# Patient Record
Sex: Male | Born: 1955 | Race: Black or African American | Hispanic: No | Marital: Married | Smoking: Former smoker
Health system: Southern US, Community
[De-identification: ages and names within clinical notes are randomized; demographics above are authoritative.]

## PROBLEM LIST (undated history)

## (undated) DIAGNOSIS — E7800 Pure hypercholesterolemia, unspecified: Secondary | ICD-10-CM

## (undated) DIAGNOSIS — K219 Gastro-esophageal reflux disease without esophagitis: Secondary | ICD-10-CM

## (undated) HISTORY — DX: Gastro-esophageal reflux disease without esophagitis: K21.9

## (undated) HISTORY — DX: Pure hypercholesterolemia, unspecified: E78.00

---

## 2001-04-08 ENCOUNTER — Ambulatory Visit (HOSPITAL_COMMUNITY): Admission: RE | Admit: 2001-04-08 | Discharge: 2001-04-08 | Payer: Self-pay | Admitting: Cardiology

## 2001-04-08 ENCOUNTER — Encounter: Payer: Self-pay | Admitting: Cardiology

## 2008-03-23 ENCOUNTER — Encounter: Admission: RE | Admit: 2008-03-23 | Discharge: 2008-03-23 | Payer: Self-pay | Admitting: Occupational Medicine

## 2009-01-10 HISTORY — PX: COLONOSCOPY: SHX174

## 2009-08-30 ENCOUNTER — Encounter: Admission: RE | Admit: 2009-08-30 | Discharge: 2009-08-30 | Payer: Self-pay | Admitting: Neurosurgery

## 2010-08-21 IMAGING — CT CT ANGIO PELVIS
2 of 5 series · 14 of 46 positions shown, 19 images · IV contrast ([ID] [id number])
Comparison: None.

CLINICAL DATA: Left leg numbness, pain, evaluate for left
iliofemoral vascular disease

CT ANGIOGRAPHY PELVIS
TECHNIQUE: Multidetector CT imaging of the pelvis was performed
using the standard protocol during bolus administration of
intravenous contrast.  Multiplanar CT image reconstructions
including MIPs were obtained to evaluate the vascular anatomy.
Contrast:  100 ml 0mnipaque-QAA

[Series 5: angio · axial · 0.70mm/px · z∈[-248,-25]mm · 11 of 107 slices shown, 16 images]
[im 9/107  soft-tissue]
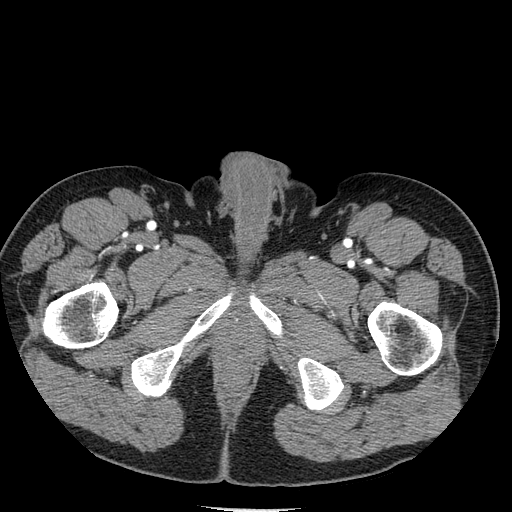
[im 9/107  bone]
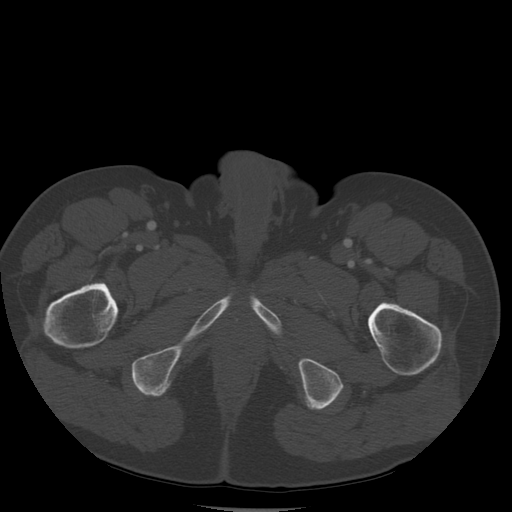
[im 17/107  soft-tissue]
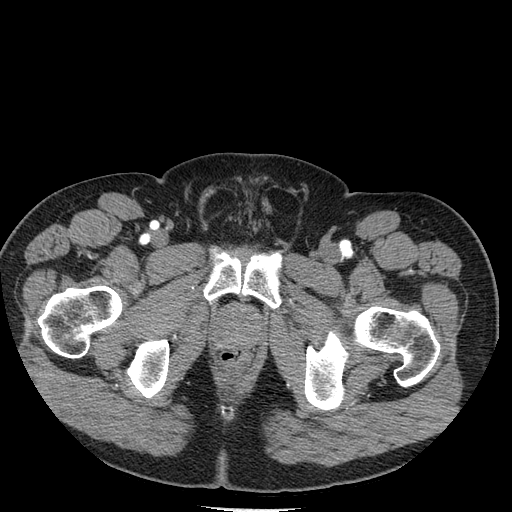
[im 33/107  soft-tissue]
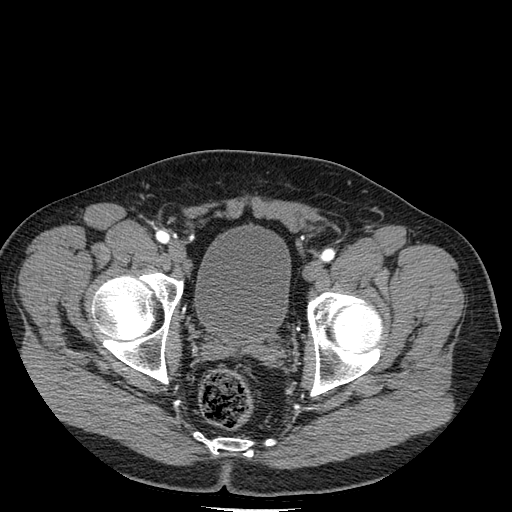
[im 41/107  soft-tissue]
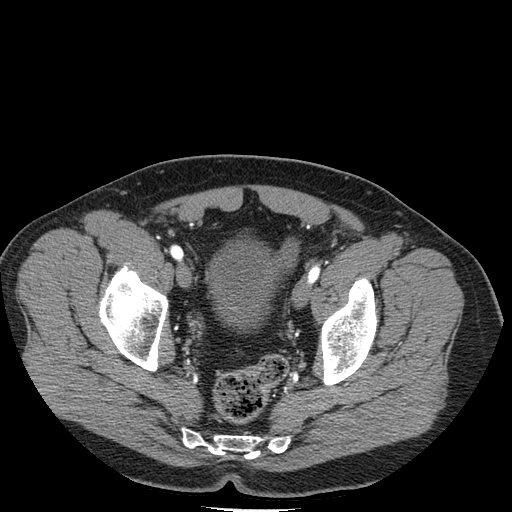
[im 49/107  soft-tissue]
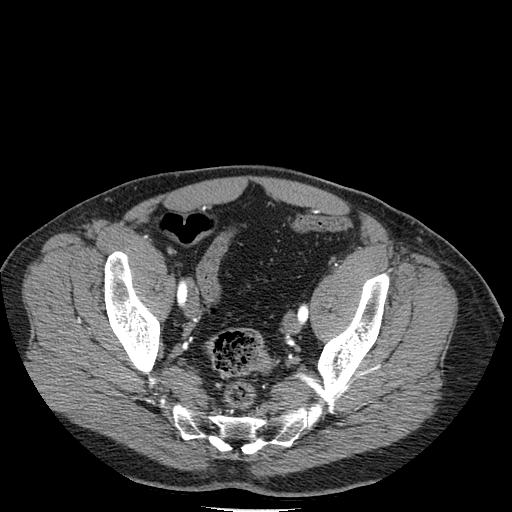
[im 58/107  soft-tissue]
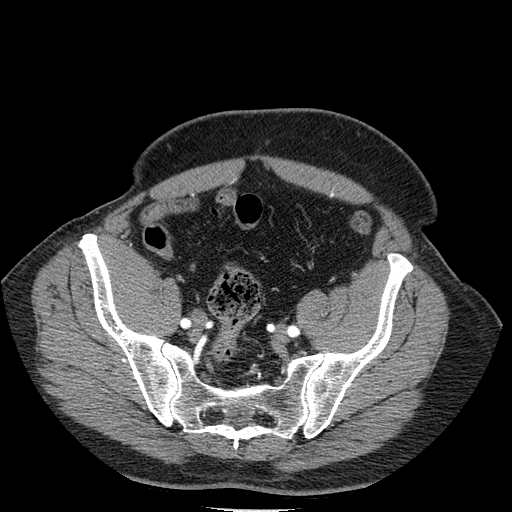
[im 66/107  soft-tissue]
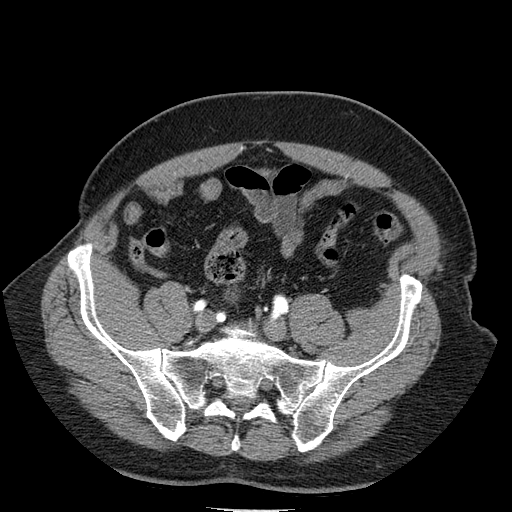
[im 74/107  lung]
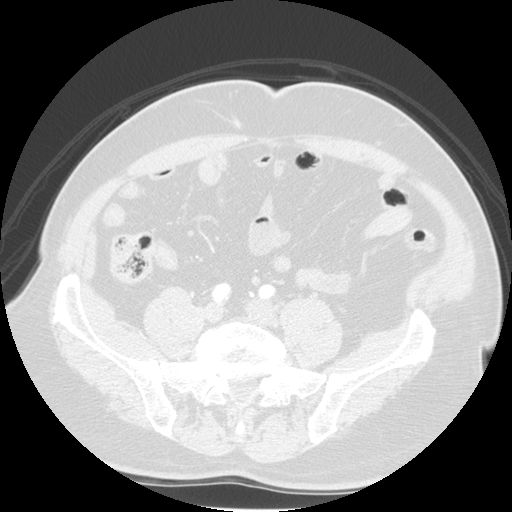
[im 82/107  soft-tissue]
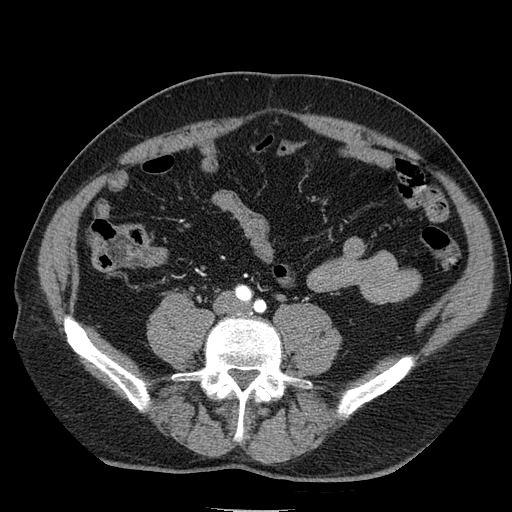
[im 82/107  lung]
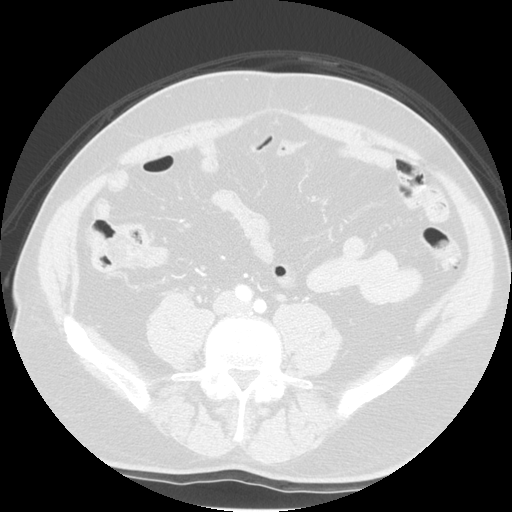
[im 90/107  soft-tissue]
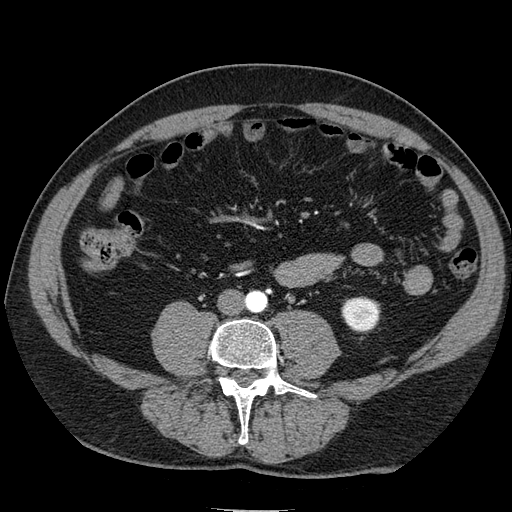
[im 90/107  lung]
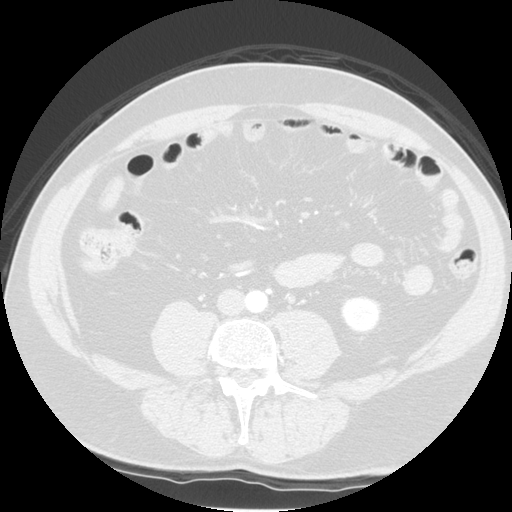
[im 90/107  bone]
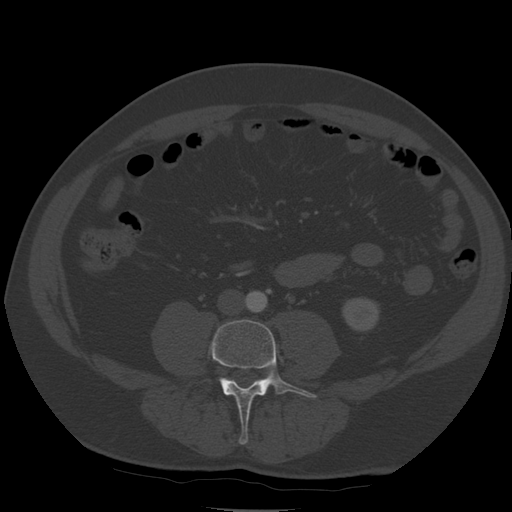
[im 98/107  soft-tissue]
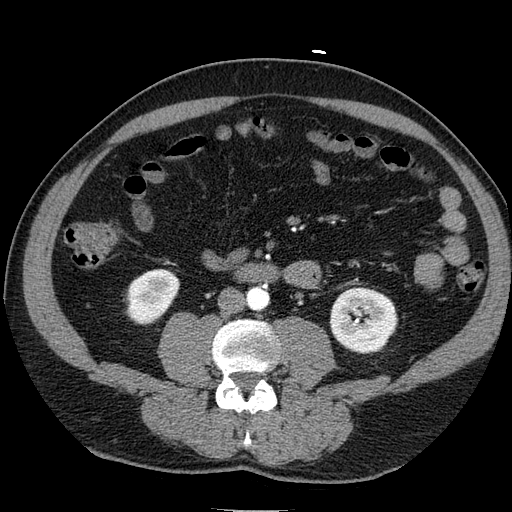
[im 98/107  lung]
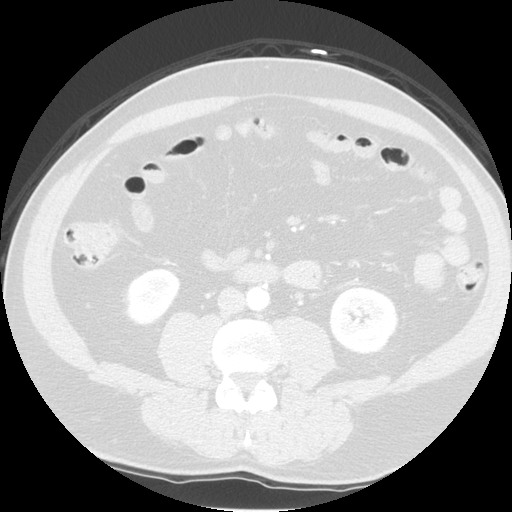

[Series 400: cor · coronal · 0.70mm/px · 3 of 153 slices shown]
[im 51/153  soft-tissue]
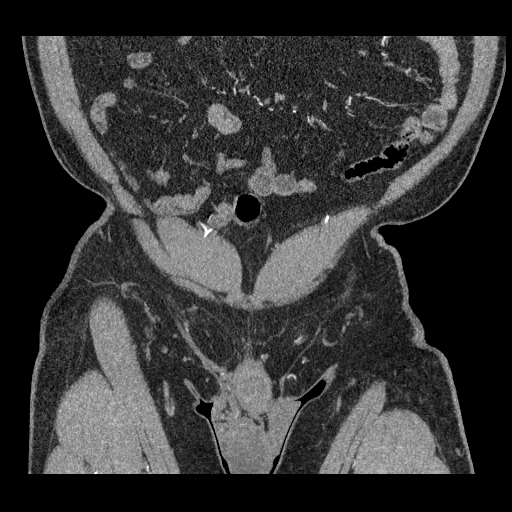
[im 68/153  soft-tissue]
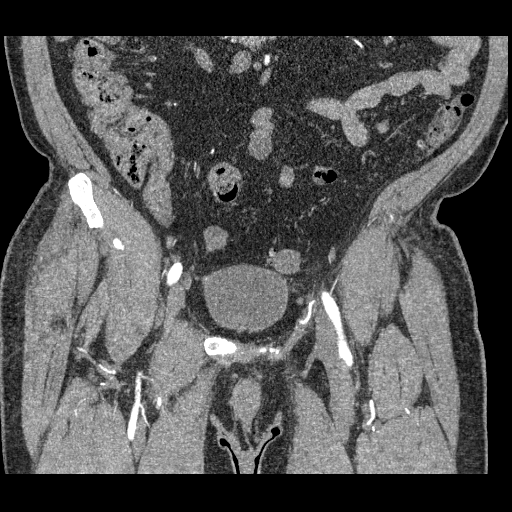
[im 85/153  soft-tissue]
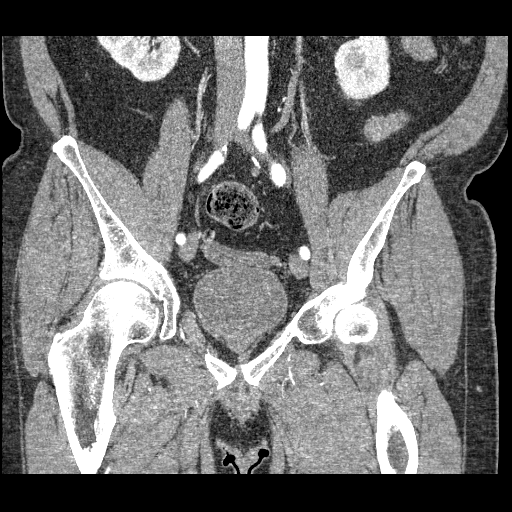

[14 of 46 positions shown; findings below may reference images not displayed]

FINDINGS: The lower portion of the infrarenal aorta is normal in
caliber.  No visualized lower infrarenal aortic aneurysm,
dissection or occlusive disease.  IMA is patent off of the anterior
aorta.  Mild tortuosity of the iliac vessels.  The common, internal
and external iliac arteries are patent.  Negative for iliac
significant stenosis, occlusion, dissection or aneurysm on either
side.  The common femoral, proximal SFA and profunda femoral
arteries are also visualized and patent.

Additional axial imaging:  There is diffuse diverticulosis of the
sigmoid.  No free fluid, fluid collection, hemorrhage or abscess.
Normal urinary bladder.  No bowel obstruction appreciate.  No
adenopathy.  Degenerative changes at L5-S1.  Small fat containing
inguinal hernias are noted bilaterally.

 Review of the MIP images confirms the above findings.
IMPRESSION: Patent pelvic iliac vessels.  Negative for significant iliac
stenosis, occlusion or dissection.
Negative for aneurysm

Sigmoid diverticulosis

Degenerative changes of L5 S1

Small fat containing inguinal hernias.

## 2011-01-12 NOTE — Cardiovascular Report (Signed)
[place]  Patient:    Juan Ruiz, Juan Ruiz                      MRN: [id number] Proc. Date: 04/08/01 Adm. Date:  [id number] Attending:  Lenoria Farrier CC:         Feliciana Rossetti, M.D., Jani Files C. Wall, M.D. [facility]  Cardiopulmonary Laboratory   Cardiac Catheterization  PROCEDURES PERFORMED:  Cardiac catheterization.  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  He had developed chest pain and was evaluated with a Cardiolite scan in [place], which suggested inferior ischemia.  Subsequently, he was referred by Dr. Shary Decamp to Dr. Daleen Squibb, who evaluated him there in our [place] office. Dr. Daleen Squibb thought his symptoms were somewhat atypical for ischemia, but felt that since he was a truck driver and had positive risk factors including a history of tobacco use, that he should be evaluated with angiography.  DESCRIPTION OF PROCEDURE:  The procedure was performed via the right femoral artery using an arterial sheath and 6 French preformed coronary catheters.  A front wall arterial puncture was performed and Omnipaque contrast was used. The right femoral artery was closed with Perclose.  The patient tolerated the procedure well and left the laboratory in satisfactory condition.  RESULTS:  The aortic pressure was 100/58 with a mean of 74.  Left ventricular pressure was 100/13.  The left main coronary artery:  The left main coronary artery was free of significant disease.  Left anterior descending:  The left anterior descending artery gave rise to two diagonal branches and three septal perforators.  These and the LAD proper were free of significant disease.  Circumflex artery:  The circumflex artery gave rise to a small marginal branch, an atrial branch, and two posterolateral branches.  These vessels were free of significant disease.  Right coronary artery:  The right coronary is a dominant vessel that  gave rise to a conus branch, right ventricular branch, a posterior descending branch and a posterolateral branch.  These vessels were free of significant disease.  LEFT VENTRICULOGRAPHY:  The left ventriculogram performed in the RAO projection shows good wall motion with no areas of hypokinesis.  The estimated ejection fraction was 60%.  CONCLUSIONS:  Normal coronary angiography and normal left ventricular wall motion.  RECOMMENDATIONS:  Reassurance.  In view of these findings, I think the recent stress test was probably a false-positive test.  I do not think his symptoms are related to ischemia.  We will plan to reassure him and let him go back to work in a few days and he will follow up with Dr. Shary Decamp.  I spoke with Dr. Shary Decamp regarding the findings today. DD:  04/08/01 TD:  04/08/01 Job: [id number]

## 2011-08-28 HISTORY — PX: INGUINAL HERNIA REPAIR: SUR1180

## 2011-08-28 HISTORY — PX: CHOLECYSTECTOMY: SHX55

## 2016-01-18 DIAGNOSIS — K219 Gastro-esophageal reflux disease without esophagitis: Secondary | ICD-10-CM | POA: Insufficient documentation

## 2016-01-18 DIAGNOSIS — E291 Testicular hypofunction: Secondary | ICD-10-CM | POA: Insufficient documentation

## 2016-01-18 DIAGNOSIS — I10 Essential (primary) hypertension: Secondary | ICD-10-CM | POA: Insufficient documentation

## 2016-01-19 DIAGNOSIS — Z720 Tobacco use: Secondary | ICD-10-CM | POA: Insufficient documentation

## 2016-01-19 DIAGNOSIS — R079 Chest pain, unspecified: Secondary | ICD-10-CM | POA: Insufficient documentation

## 2016-01-19 DIAGNOSIS — R9431 Abnormal electrocardiogram [ECG] [EKG]: Secondary | ICD-10-CM | POA: Insufficient documentation

## 2016-01-19 DIAGNOSIS — E7800 Pure hypercholesterolemia, unspecified: Secondary | ICD-10-CM | POA: Insufficient documentation

## 2017-01-18 DIAGNOSIS — E119 Type 2 diabetes mellitus without complications: Secondary | ICD-10-CM | POA: Insufficient documentation

## 2017-01-18 DIAGNOSIS — Z79899 Other long term (current) drug therapy: Secondary | ICD-10-CM | POA: Insufficient documentation

## 2017-04-26 DIAGNOSIS — N1831 Chronic kidney disease, stage 3a: Secondary | ICD-10-CM | POA: Insufficient documentation

## 2017-07-17 DIAGNOSIS — R972 Elevated prostate specific antigen [PSA]: Secondary | ICD-10-CM | POA: Insufficient documentation

## 2017-08-09 DIAGNOSIS — G4733 Obstructive sleep apnea (adult) (pediatric): Secondary | ICD-10-CM | POA: Insufficient documentation

## 2017-08-09 DIAGNOSIS — R5382 Chronic fatigue, unspecified: Secondary | ICD-10-CM | POA: Insufficient documentation

## 2018-10-15 ENCOUNTER — Encounter: Payer: Self-pay | Admitting: Gastroenterology

## 2018-10-24 ENCOUNTER — Encounter: Payer: Self-pay | Admitting: Gastroenterology

## 2018-10-31 ENCOUNTER — Ambulatory Visit: Payer: BLUE CROSS/BLUE SHIELD | Admitting: Gastroenterology

## 2018-10-31 ENCOUNTER — Encounter (INDEPENDENT_AMBULATORY_CARE_PROVIDER_SITE_OTHER): Payer: Self-pay

## 2018-10-31 ENCOUNTER — Encounter: Payer: Self-pay | Admitting: Gastroenterology

## 2018-10-31 VITALS — BP 138/90 | HR 70 | Ht 67.0 in | Wt 193.0 lb

## 2018-10-31 DIAGNOSIS — Z1211 Encounter for screening for malignant neoplasm of colon: Secondary | ICD-10-CM

## 2018-10-31 MED ORDER — SUPREP BOWEL PREP KIT 17.5-3.13-1.6 GM/177ML PO SOLN: 1.0000 | ORAL | 0 refills | Status: AC

## 2018-10-31 NOTE — Progress Notes (Signed)
Chief Complaint:   Referring Provider:  No ref. provider found      ASSESSMENT AND PLAN;   #1. Colorectal cancer screening. #2. FH colonic polyps (dad > age 65).  Plan:  - Proceed with colonoscopy. Discussed risks & benefits. (Risks including rare perforation req laparotomy, bleeding after biopsies/polypectomy req blood transfusion, rare chance of missing neoplasms, risks of anesthesia/sedation). Benefits outweigh the risks. Patient agrees to proceed. All the questions were answered. Consent forms given for review.    HPI:    Juan Ruiz is a 63 y.o. male  No nausea, vomiting, heartburn, regurgitation, odynophagia or dysphagia.  No significant diarrhea or constipation.  There is no melena or hematochezia. No unintentional weight loss. Dad had polyps at age 27. No abdo pain.  SH: Accompanied by his wife-Gracie Ludeke (patient of ours) Past GI procedures: -Colonoscopy 12/2008 ([facility])-predominantly left colonic diverticulosis, small internal hemorrhoids. Past Medical History:  Diagnosis Date  . GERD (gastroesophageal reflux disease)   . Hypercholesteremia     Past Surgical History:  Procedure Laterality Date  . CHOLECYSTECTOMY  2013  . COLONOSCOPY  01/10/2009   Predominantly left colonic diverticulosis. Small internal hemorrhoids. Otherwise normal colonoscopy.   . INGUINAL HERNIA REPAIR  2013   Dr. Logan Bores    Family History  Problem Relation Age of Onset  . Diabetes Father   . Colon cancer Neg Hx     Social History   Tobacco Use  . Smoking status: Former Games developer  . Smokeless tobacco: Former Engineer, water Use Topics  . Alcohol use: Not Currently  . Drug use: Never    Current Outpatient Medications  Medication Sig Dispense Refill  . esomeprazole (NEXIUM) 20 MG capsule Take 20 mg by mouth 2 (two) times daily.    . fluticasone (FLONASE) 50 MCG/ACT nasal spray Place 1-2 sprays into both nostrils daily.    Marland Kitchen gentamicin (GARAMYCIN) 0.3 % ophthalmic solution  Place 1 drop into both eyes as needed.    Marland Kitchen losartan (COZAAR) 100 MG tablet Take 100 mg by mouth daily.    . pravastatin (PRAVACHOL) 80 MG tablet Take 80 mg by mouth at bedtime.    . sildenafil (REVATIO) 20 MG tablet Take 20 mg by mouth as needed. Take up to 4 tablets by mouth 1 hour prior to sex as needed.    Marland Kitchen spironolactone (ALDACTONE) 25 MG tablet Take 25 mg by mouth daily.    . traMADol (ULTRAM) 50 MG tablet Take 50 mg by mouth every 8 (eight) hours as needed.     No current facility-administered medications for this visit.     Not on File  Review of Systems:  Constitutional: Denies fever, chills, diaphoresis, appetite change and fatigue.  HEENT: Denies photophobia, eye pain, redness, hearing loss, ear pain, congestion, sore throat, rhinorrhea, sneezing, mouth sores, neck pain, neck stiffness and tinnitus.   Respiratory: Denies SOB, DOE, cough, chest tightness,  and wheezing.   Cardiovascular: Denies chest pain, palpitations and leg swelling.  Genitourinary: Denies dysuria, urgency, frequency, hematuria, flank pain and difficulty urinating.  Musculoskeletal: Denies myalgias, back pain, joint swelling, arthralgias and gait problem.  Skin: No rash.  Neurological: Denies dizziness, seizures, syncope, weakness, light-headedness, numbness and headaches.  Hematological: Denies adenopathy. Easy bruising, personal or family bleeding history  Psychiatric/Behavioral: No anxiety or depression     Physical Exam:    BP 138/90   Pulse 70   Ht 5\' 7"  (1.702 m)   Wt 193 lb (87.5 kg)  SpO2 96%   BMI 30.23 kg/m  Filed Weights   10/31/18 1344  Weight: 193 lb (87.5 kg)   Constitutional:  Well-developed, in no acute distress. Psychiatric: Normal mood and affect. Behavior is normal. HEENT: Pupils normal.  Conjunctivae are normal. No scleral icterus. Cardiovascular: Normal rate, regular rhythm. No edema Pulmonary/chest: Effort normal and breath sounds normal. No wheezing, rales or  rhonchi. Abdominal: Soft, nondistended. Nontender. Bowel sounds active throughout. There are no masses palpable. No hepatomegaly. Rectal:  defered Neurological: Alert and oriented to person place and time. Skin: Skin is warm and dry. No rashes noted.     Edman Circle, MD 10/31/2018, 1:59 PM  Cc: No ref. provider found

## 2018-10-31 NOTE — Patient Instructions (Signed)
If you are age 65 or older, your body mass index should be between 23-30. Your Body mass index is 30.23 kg/m. If this is out of the aforementioned range listed, please consider follow up with your Primary Care Provider.  If you are age 71 or younger, your body mass index should be between 19-25. Your Body mass index is 30.23 kg/m. If this is out of the aformentioned range listed, please consider follow up with your Primary Care Provider.    You have been scheduled for a colonoscopy. Please follow written instructions given to you at your visit today.  Please pick up your prep supplies at the pharmacy within the next 1-3 days. If you use inhalers (even only as needed), please bring them with you on the day of your procedure. Your physician has requested that you go to www.startemmi.com and enter the access code given to you at your visit today. This web site gives a general overview about your procedure. However, you should still follow specific instructions given to you by our office regarding your preparation for the procedure.   We have sent the following medications to your pharmacy for you to pick up at your convenience:  Suprep  Thank you,  Dr. Lynann Bologna

## 2018-11-24 ENCOUNTER — Encounter: Payer: Self-pay | Admitting: Gastroenterology

## 2018-12-08 ENCOUNTER — Encounter: Payer: BLUE CROSS/BLUE SHIELD | Admitting: Gastroenterology

## 2018-12-29 ENCOUNTER — Telehealth: Payer: Self-pay | Admitting: *Deleted

## 2018-12-29 NOTE — Telephone Encounter (Signed)
Spoke with patient to reschedule the screening colonoscopy. Pt request to cancelled the colonoscopy until "all this virus is over". Pt denies any GI concerns/symptoms. Patient needs a Monday so I did offer patient to have Monday 5/11 but he still declined and states he will call us back to reschedule this colonoscopy when he feels comfortable. Colonoscopy cancelled at this time pt is aware.

## 2019-01-12 ENCOUNTER — Encounter: Payer: BLUE CROSS/BLUE SHIELD | Admitting: Gastroenterology

## 2019-04-24 DIAGNOSIS — M2550 Pain in unspecified joint: Secondary | ICD-10-CM | POA: Insufficient documentation

## 2020-05-13 DIAGNOSIS — M65331 Trigger finger, right middle finger: Secondary | ICD-10-CM | POA: Insufficient documentation

## 2021-04-21 ENCOUNTER — Ambulatory Visit: Payer: Medicare HMO | Admitting: Sports Medicine

## 2021-04-25 ENCOUNTER — Telehealth: Payer: Self-pay

## 2021-04-25 NOTE — Telephone Encounter (Signed)
Patient called to advise that she has tested positive for Covid today.  I advised per Dr. Melvyn Neth to call back in 5-7 days and we will go from there.  Dorothy Spark aware as well.

## 2021-05-12 ENCOUNTER — Ambulatory Visit: Payer: Medicare HMO | Admitting: Sports Medicine

## 2021-05-12 ENCOUNTER — Other Ambulatory Visit: Payer: Self-pay

## 2021-05-12 ENCOUNTER — Encounter: Payer: Self-pay | Admitting: Sports Medicine

## 2021-05-12 DIAGNOSIS — M79674 Pain in right toe(s): Secondary | ICD-10-CM

## 2021-05-12 DIAGNOSIS — M79675 Pain in left toe(s): Secondary | ICD-10-CM | POA: Diagnosis not present

## 2021-05-12 DIAGNOSIS — L600 Ingrowing nail: Secondary | ICD-10-CM

## 2021-05-12 NOTE — Patient Instructions (Signed)

## 2021-05-12 NOTE — Progress Notes (Signed)
Subjective: Juan Ruiz is a 65 y.o. male patient presents to office today complaining of issues with ingrown toenails.  Patient reports that the left first toe medial corner and the right first toe lateral corner there is constant pain where his daughter has to help to get his ingrown right now they are doing okay but wants to talk about treatment. Patient denies any current infection to the affected areas/fever/chills/nausea/vomitting/any other related constitutional symptoms at this time.  There are no problems to display for this patient.   Current Outpatient Medications on File Prior to Visit  Medication Sig Dispense Refill   esomeprazole (NEXIUM) 20 MG capsule Take 20 mg by mouth 2 (two) times daily.     fluticasone (FLONASE) 50 MCG/ACT nasal spray Place 1-2 sprays into both nostrils daily.     gentamicin (GARAMYCIN) 0.3 % ophthalmic solution Place 1 drop into both eyes as needed.     losartan (COZAAR) 100 MG tablet Take 100 mg by mouth daily.     pravastatin (PRAVACHOL) 80 MG tablet Take 80 mg by mouth at bedtime.     sildenafil (REVATIO) 20 MG tablet Take 20 mg by mouth as needed. Take up to 4 tablets by mouth 1 hour prior to sex as needed.     spironolactone (ALDACTONE) 25 MG tablet Take 25 mg by mouth daily.     SUPREP BOWEL PREP KIT 17.5-3.13-1.6 GM/177ML SOLN Take 1 kit by mouth as directed. 354 mL 0   traMADol (ULTRAM) 50 MG tablet Take 50 mg by mouth every 8 (eight) hours as needed.     No current facility-administered medications on file prior to visit.    Not on File  Objective:  There were no vitals filed for this visit.  General: Well developed, nourished, in no acute distress, alert and oriented x3   Dermatology: Skin is warm, dry and supple bilateral.  Right hallux nail appears to be moderately incurvated with hyperkeratosis formation at the distal aspects of the lateral nail border. (-) Erythema. (+) Edema. (-) serosanguous drainage present.  There is also  incurvation noted to the left hallux nail where the medial margin appears to be moderately incurvated with hyperkeratosis at the nail border.  Mild localized edema.  No erythema no serosanguineous drainage.  The remaining nails appear unremarkable at this time. There are no open sores, lesions or other signs of infection  present.  Vascular: Dorsalis Pedis artery and Posterior Tibial artery pedal pulses are 2/4 bilateral with immedate capillary fill time. Pedal hair growth present. No lower extremity edema.   Neruologic: Grossly intact via light touch bilateral.  Musculoskeletal: Tenderness to palpation of the left hallux medial and right hallux lateral left hallux medial and right hallux lateral nail folds.  Muscular strength within normal limits in all groups bilateral.   Assesement and Plan: Problem List Items Addressed This Visit   None Visit Diagnoses     Ingrown nail    -  Primary   Toe pain, bilateral           -Discussed treatment alternatives and plan of care; Explained permanent/temporary nail avulsion and post procedure course to patient. Patient elects for PNA left hallux medial and right hallux lateral nail borders with phenol - After a verbal and written consent, injected 3 ml of a 50:50 mixture of 2% plain  lidocaine and 0.5% plain marcaine in a normal hallux block fashion. Next, a  betadine prep was performed. Anesthesia was tested and found to be appropriate.  The offending left hallux medial and right hallux lateral nail borders were then incised from the hyponychium to the epinychium. The offending nail border was removed and cleared from the field. The area was curretted for any remaining nail or spicules. Phenol application performed and the area was then flushed with alcohol and dressed with antibiotic cream and a dry sterile dressing. -Patient was instructed to leave the dressing intact for today and begin soaking  in a weak solution of betadine or Epsom salt and  water tomorrow. Patient was instructed to soak for 15-20 minutes each day and apply neosporin/corticosporin and a gauze or bandaid dressing each day. -Patient was instructed to monitor the toe for signs of infection and return to office if toe becomes red, hot or swollen. -Advised ice, elevation, and tylenol or motrin if needed for pain.  -Patient is to return in 2 weeks for follow up care/nail check or sooner if problems arise.  Landis Martins, DPM

## 2021-06-02 ENCOUNTER — Other Ambulatory Visit: Payer: Self-pay

## 2021-06-02 ENCOUNTER — Ambulatory Visit: Payer: Medicare HMO | Admitting: Sports Medicine

## 2021-06-02 ENCOUNTER — Encounter: Payer: Self-pay | Admitting: Sports Medicine

## 2021-06-02 DIAGNOSIS — Z9889 Other specified postprocedural states: Secondary | ICD-10-CM

## 2021-06-02 DIAGNOSIS — M79675 Pain in left toe(s): Secondary | ICD-10-CM

## 2021-06-02 DIAGNOSIS — L600 Ingrowing nail: Secondary | ICD-10-CM

## 2021-06-02 DIAGNOSIS — M79674 Pain in right toe(s): Secondary | ICD-10-CM

## 2021-06-02 NOTE — Progress Notes (Signed)
Subjective: Juan Ruiz is a 65 y.o. male patient returns to office today for follow up evaluation after having PNA left hallux medial and right hallux lateral nail borders with phenol performed on 05-12-21.  Patient has been soaking using epsom salt and applying topical antibiotic covered with bandaid daily. Patient deniesfever/chills/nausea/vomitting/any other related constitutional symptoms at this time.  There are no problems to display for this patient.   Current Outpatient Medications on File Prior to Visit  Medication Sig Dispense Refill   esomeprazole (NEXIUM) 20 MG capsule Take 20 mg by mouth 2 (two) times daily.     fluticasone (FLONASE) 50 MCG/ACT nasal spray Place 1-2 sprays into both nostrils daily.     gentamicin (GARAMYCIN) 0.3 % ophthalmic solution Place 1 drop into both eyes as needed.     losartan (COZAAR) 100 MG tablet Take 100 mg by mouth daily.     pravastatin (PRAVACHOL) 80 MG tablet Take 80 mg by mouth at bedtime.     sildenafil (REVATIO) 20 MG tablet Take 20 mg by mouth as needed. Take up to 4 tablets by mouth 1 hour prior to sex as needed.     spironolactone (ALDACTONE) 25 MG tablet Take 25 mg by mouth daily.     SUPREP BOWEL PREP KIT 17.5-3.13-1.6 GM/177ML SOLN Take 1 kit by mouth as directed. 354 mL 0   traMADol (ULTRAM) 50 MG tablet Take 50 mg by mouth every 8 (eight) hours as needed.     No current facility-administered medications on file prior to visit.    Not on File  Objective:  General: Well developed, nourished, in no acute distress, alert and oriented x3   Dermatology: Skin is warm, dry and supple bilateral. Left hallux medial and right hallux lateral nail bed appears to be clean, dry, with mild granular tissue and surrounding eschar/scab. (-) Erythema. (-) Edema. (-) serosanguous drainage present. The remaining nails appear unremarkable at this time. There are no other lesions or other signs of infection present.  Neurovascular status: Intact. No  lower extremity swelling; No pain with calf compression bilateral.  Musculoskeletal: Decreased tenderness to palpation of the right and left hallux nail fold(s). Muscular strength within normal limits bilateral.   Assesement and Plan: Problem List Items Addressed This Visit   None Visit Diagnoses     S/P nail surgery    -  Primary   Ingrown nail       Toe pain, bilateral           -Examined patient  -Cleansed left hallux medial and right hallux lateral nail borders and gently scrubbed with peroxide and using a tissue nipper the left hallux medial border was gently debrided to patient's tolerance to healthy bleeding applied antibiotic cream covered with bandaid.  -Discussed plan of care with patient. -Patient d/c soaking, d apply neosporin and a gauze or bandaid dressing each day as needed. May leave open to air at night. -Educated patient on long term care after nail surgery. -Patient was instructed to monitor the toe for reoccurrence and signs of infection; Patient advised to return to office or go to ER if toe becomes red, hot or swollen. -Patient is to return as needed or sooner if problems arise.  Landis Martins, DPM

## 2021-07-24 ENCOUNTER — Telehealth: Payer: Self-pay | Admitting: Urology

## 2021-07-24 ENCOUNTER — Other Ambulatory Visit: Payer: Self-pay | Admitting: Sports Medicine

## 2021-07-24 MED ORDER — AMOXICILLIN-POT CLAVULANATE 875-125 MG PO TABS: 1.0000 | ORAL_TABLET | Freq: Two times a day (BID) | ORAL | 0 refills | Status: DC

## 2021-07-24 NOTE — Telephone Encounter (Signed)
Pt stated that the toe nail that you cut on that he had the ingrown on when he saw you back on 06/02/21 pt stats that it is still sore/red and wants to know what he needs to do? Please Advise.

## 2021-07-24 NOTE — Telephone Encounter (Signed)
Pt was advised of Dr. Wynema Birch recommendations and Rx to pick up. Pt stated understanding.

## 2021-07-24 NOTE — Progress Notes (Signed)
Rx sent to [place] and advised patient to soak for pain at toenail -Dr. Marylene Land

## 2021-08-18 ENCOUNTER — Ambulatory Visit: Payer: Medicare HMO | Admitting: Sports Medicine

## 2021-08-18 ENCOUNTER — Encounter: Payer: Self-pay | Admitting: Sports Medicine

## 2021-08-18 DIAGNOSIS — Z9889 Other specified postprocedural states: Secondary | ICD-10-CM

## 2021-08-18 DIAGNOSIS — M79675 Pain in left toe(s): Secondary | ICD-10-CM

## 2021-08-18 DIAGNOSIS — L600 Ingrowing nail: Secondary | ICD-10-CM

## 2021-08-18 DIAGNOSIS — G8929 Other chronic pain: Secondary | ICD-10-CM

## 2021-08-18 NOTE — Progress Notes (Signed)
Subjective: Juan Ruiz is a 64 y.o. male patient presents to office today complaining of pain at the left hallux medial border states that the redness is gone down since he has been on the Augmentin not as sore as it was did soak it does not see anything draining but still a little sore with pressure.  This procedure was previously done on patient back in September and still has some issues with it states that the right 1 has healed up really well.  Patient Active Problem List   Diagnosis Date Noted   Trigger middle finger of right hand 05/13/2020   Generalized joint pain 04/24/2019   Chronic fatigue and malaise 08/09/2017   Obstructive sleep apnea syndrome 08/09/2017   Elevated PSA 07/17/2017   Chronic kidney disease, stage 3a ([place]) 04/26/2017   High risk medication use 01/18/2017   Type 2 diabetes mellitus without complication, without long-term current use of insulin ([place]) 01/18/2017   Abnormal EKG 01/19/2016   Chest pain in adult 01/19/2016   Hypercholesterolemia 01/19/2016   Tobacco abuse 01/19/2016   Benign essential hypertension 01/18/2016   Gastroesophageal reflux disease without esophagitis 01/18/2016   Hypogonadism in male 01/18/2016    Current Outpatient Medications on File Prior to Visit  Medication Sig Dispense Refill   glipiZIDE (GLUCOTROL XL) 5 MG 24 hr tablet Take 1 tablet by mouth daily.     losartan (COZAAR) 100 MG tablet Take 1 tablet by mouth daily.     metoprolol succinate (TOPROL-XL) 25 MG 24 hr tablet Take 1 tablet by mouth daily.     neomycin-polymyxin-hydrocortisone (CORTISPORIN) 3.5-10000-1 OTIC suspension Place 4 drops into the affected ear four times daily for 7 days     traMADol (ULTRAM) 50 MG tablet Take 1 tablet by mouth every 8 (eight) hours as needed.     triamcinolone acetonide (TRIESENCE) 40 MG/ML SUSP      amoxicillin-clavulanate (AUGMENTIN) 875-125 MG tablet Take 1 tablet by mouth 2 (two) times daily. 28 tablet 0   esomeprazole (NEXIUM) 20 MG  capsule Take 20 mg by mouth 2 (two) times daily.     fluticasone (FLONASE) 50 MCG/ACT nasal spray Place 1-2 sprays into both nostrils daily.     gentamicin (GARAMYCIN) 0.3 % ophthalmic solution Place 1 drop into both eyes as needed.     losartan (COZAAR) 100 MG tablet Take 100 mg by mouth daily.     pravastatin (PRAVACHOL) 80 MG tablet Take 80 mg by mouth at bedtime.     ramipril (ALTACE) 5 MG capsule Take by mouth.     sildenafil (REVATIO) 20 MG tablet Take 20 mg by mouth as needed. Take up to 4 tablets by mouth 1 hour prior to sex as needed.     spironolactone (ALDACTONE) 25 MG tablet Take 25 mg by mouth daily.     SUPREP BOWEL PREP KIT 17.5-3.13-1.6 GM/177ML SOLN Take 1 kit by mouth as directed. 354 mL 0   testosterone cypionate (DEPOTESTOSTERONE CYPIONATE) 200 MG/ML injection Inject into the muscle.     traMADol (ULTRAM) 50 MG tablet Take 50 mg by mouth every 8 (eight) hours as needed.     No current facility-administered medications on file prior to visit.    No Known Allergies  Objective:  There were no vitals filed for this visit.  General: Well developed, nourished, in no acute distress, alert and oriented x3   Dermatology: Skin is warm, dry and supple bilateral.  Left hallux nail appears to be painful at the medial  nail fold with concern for possible dead skin versus remnants of nail still present in the area there is mild blanchable erythema no edema no active drainage.  No other open lesions or other signs of infection present.  Vascular: Dorsalis Pedis artery and Posterior Tibial artery pedal pulses are 2/4 bilateral with immedate capillary fill time. Pedal hair growth present. No lower extremity edema.   Neruologic: Grossly intact via light touch bilateral.  Musculoskeletal: Tenderness to palpation of the left hallux medial nail fold.  Muscular strength within normal limits in all groups bilateral.   Assesement and Plan: Problem List Items Addressed This Visit    None Visit Diagnoses     Chronic toe pain, left foot    -  Primary   Relevant Medications   traMADol (ULTRAM) 50 MG tablet   Ingrown nail       S/P nail surgery           -Discussed treatment alternatives and plan of care; Explained permanent/temporary nail avulsion and post procedure course to patient. Patient elects for repeat procedure of me removing dead skin at the left hallux medial nail fold and possible nail with phenol - After a verbal and written consent, injected 3 ml of a 50:50 mixture of 2% plain  lidocaine and 0.5% plain marcaine in a normal hallux block fashion. Next, a  betadine prep was performed. Anesthesia was tested and found to be appropriate.  The offending left hallux medial nail border was then incised from the hyponychium to the epinychium as well as any dead skin in the nail fold. The offending nail border was removed and cleared from the field. The area was curretted for any remaining nail or spicules. Phenol application performed and the area was then flushed with alcohol and dressed with antibiotic cream and a dry sterile dressing. -Patient was instructed to leave the dressing intact for today and begin soaking  in a weak solution of betadine or Epsom salt and water tomorrow. Patient was instructed to  soak for 15-20 minutes each day and apply neosporin and a gauze or bandaid dressing each day. -Patient was instructed to monitor the toe for signs of infection and return to office if toe becomes red, hot or swollen. -Continue with Augmentin antibiotic until finished -Advised ice, elevation, and tylenol or motrin if needed for pain.  -Patient is to return in 2-3 weeks for follow up care/nail check or sooner if problems arise.  Landis Martins, DPM

## 2021-08-18 NOTE — Patient Instructions (Signed)

## 2021-09-08 ENCOUNTER — Other Ambulatory Visit: Payer: Self-pay

## 2021-09-08 ENCOUNTER — Encounter: Payer: Self-pay | Admitting: Sports Medicine

## 2021-09-08 ENCOUNTER — Ambulatory Visit: Payer: Medicare HMO | Admitting: Sports Medicine

## 2021-09-08 DIAGNOSIS — Z9889 Other specified postprocedural states: Secondary | ICD-10-CM

## 2021-09-08 DIAGNOSIS — M79675 Pain in left toe(s): Secondary | ICD-10-CM

## 2021-09-08 NOTE — Progress Notes (Signed)
Subjective: Juan Ruiz is a 66 y.o. male patient returns to office today for follow up evaluation after having left hallux medial permanent nail avulsion performed on (08/18/2021). Patient has been soaking using epsom salt and applying topical antibiotic covered with bandaid daily. Patient reports that toe was feeling good, deniesfever/chills/nausea/vomitting/any other related constitutional symptoms at this time.  Patient Active Problem List   Diagnosis Date Noted   Trigger middle finger of right hand 05/13/2020   Generalized joint pain 04/24/2019   Chronic fatigue and malaise 08/09/2017   Obstructive sleep apnea syndrome 08/09/2017   Elevated PSA 07/17/2017   Chronic kidney disease, stage 3a ([place]) 04/26/2017   High risk medication use 01/18/2017   Type 2 diabetes mellitus without complication, without long-term current use of insulin ([place]) 01/18/2017   Abnormal EKG 01/19/2016   Chest pain in adult 01/19/2016   Hypercholesterolemia 01/19/2016   Tobacco abuse 01/19/2016   Benign essential hypertension 01/18/2016   Gastroesophageal reflux disease without esophagitis 01/18/2016   Hypogonadism in male 01/18/2016    Current Outpatient Medications on File Prior to Visit  Medication Sig Dispense Refill   amoxicillin-clavulanate (AUGMENTIN) 875-125 MG tablet Take 1 tablet by mouth 2 (two) times daily. 28 tablet 0   esomeprazole (NEXIUM) 20 MG capsule Take 20 mg by mouth 2 (two) times daily.     fluticasone (FLONASE) 50 MCG/ACT nasal spray Place 1-2 sprays into both nostrils daily.     gentamicin (GARAMYCIN) 0.3 % ophthalmic solution Place 1 drop into both eyes as needed.     glipiZIDE (GLUCOTROL XL) 5 MG 24 hr tablet Take 1 tablet by mouth daily.     losartan (COZAAR) 100 MG tablet Take 100 mg by mouth daily.     losartan (COZAAR) 100 MG tablet Take 1 tablet by mouth daily.     metoprolol succinate (TOPROL-XL) 25 MG 24 hr tablet Take 1 tablet by mouth daily.      neomycin-polymyxin-hydrocortisone (CORTISPORIN) 3.5-10000-1 OTIC suspension Place 4 drops into the affected ear four times daily for 7 days     pravastatin (PRAVACHOL) 80 MG tablet Take 80 mg by mouth at bedtime.     ramipril (ALTACE) 5 MG capsule Take by mouth.     sildenafil (REVATIO) 20 MG tablet Take 20 mg by mouth as needed. Take up to 4 tablets by mouth 1 hour prior to sex as needed.     spironolactone (ALDACTONE) 25 MG tablet Take 25 mg by mouth daily.     SUPREP BOWEL PREP KIT 17.5-3.13-1.6 GM/177ML SOLN Take 1 kit by mouth as directed. 354 mL 0   testosterone cypionate (DEPOTESTOSTERONE CYPIONATE) 200 MG/ML injection Inject into the muscle.     traMADol (ULTRAM) 50 MG tablet Take 50 mg by mouth every 8 (eight) hours as needed.     traMADol (ULTRAM) 50 MG tablet Take 1 tablet by mouth every 8 (eight) hours as needed.     triamcinolone acetonide (TRIESENCE) 40 MG/ML SUSP      No current facility-administered medications on file prior to visit.    No Known Allergies  Objective:  General: Well developed, nourished, in no acute distress, alert and oriented x3   Dermatology: Skin is warm, dry and supple bilateral.  Left hallux medial bed appears to be clean, dry, with mild granular tissue and surrounding eschar/scab. (-) Erythema. (-) Edema. (-) serosanguous drainage present. The remaining nails appear unremarkable at this time. There are no other lesions or other signs of infection  present.  Neurovascular  status: Intact. No lower extremity swelling; No pain with calf compression bilateral.  Musculoskeletal: Decreased tenderness to palpation of the left hallux medial nail fold.  Muscular strength within normal limits bilateral.   Assesement and Plan: Problem List Items Addressed This Visit   None Visit Diagnoses     S/P nail surgery    -  Primary   Toe pain, left           -Examined patient  -Cleansed left hallux medial nail fold and gently scrubbed with peroxide; area is  well healed may discontinue soaking May discontinue antibiotic cream and Band-Aid and leave open to air -Patient was instructed to monitor the toe for reoccurrence and signs of infection; Patient advised to return to office or go to ER if toe becomes red, hot or swollen. -Patient is to return as needed or sooner if problems arise.  Landis Martins, DPM

## 2024-07-09 ENCOUNTER — Encounter (HOSPITAL_BASED_OUTPATIENT_CLINIC_OR_DEPARTMENT_OTHER): Payer: Self-pay

## 2024-07-09 ENCOUNTER — Ambulatory Visit (HOSPITAL_BASED_OUTPATIENT_CLINIC_OR_DEPARTMENT_OTHER): Admission: EM | Admit: 2024-07-09 | Discharge: 2024-07-09 | Disposition: A | Discharge status: Home / Self Care

## 2024-07-09 ENCOUNTER — Ambulatory Visit (INDEPENDENT_AMBULATORY_CARE_PROVIDER_SITE_OTHER): Admit: 2024-07-09 | Discharge: 2024-07-09 | Disposition: A | Discharge status: Home / Self Care | Admitting: Radiology

## 2024-07-09 ENCOUNTER — Other Ambulatory Visit: Payer: Self-pay

## 2024-07-09 ENCOUNTER — Ambulatory Visit (HOSPITAL_BASED_OUTPATIENT_CLINIC_OR_DEPARTMENT_OTHER): Payer: Self-pay | Admitting: Family Medicine

## 2024-07-09 DIAGNOSIS — R079 Chest pain, unspecified: Secondary | ICD-10-CM | POA: Diagnosis not present

## 2024-07-09 DIAGNOSIS — R051 Acute cough: Secondary | ICD-10-CM

## 2024-07-09 DIAGNOSIS — R0789 Other chest pain: Secondary | ICD-10-CM

## 2024-07-09 NOTE — ED Provider Notes (Signed)
 [facility]    CSN: [id number] Arrival date & time: 07/09/24  1121      History   Chief Complaint Chief Complaint  Patient presents with   Chest Pain    HPI Juan Ruiz is a 68 y.o. male.     Fatigue onset yesterday. Woke up this morning with pain to chest during deep breath. Took mucinex and discomfort eased. States has gotten clammy a few times. Reports similar symptoms once and was dx with pneumonia. No cough, no sinus congestion. No hx of lung disease. No pain currently. States very painful this morning.   Chest Pain   Past Medical History:  Diagnosis Date   GERD (gastroesophageal reflux disease)    Hypercholesteremia     Patient Active Problem List   Diagnosis Date Noted   Trigger middle finger of right hand 05/13/2020   Generalized joint pain 04/24/2019   Chronic fatigue and malaise 08/09/2017   Obstructive sleep apnea syndrome 08/09/2017   Elevated PSA 07/17/2017   Chronic kidney disease, stage 3a ([facility]) 04/26/2017   High risk medication use 01/18/2017   Type 2 diabetes mellitus without complication, without long-term current use of insulin ([facility]) 01/18/2017   Abnormal EKG 01/19/2016   Chest pain in adult 01/19/2016   Hypercholesterolemia 01/19/2016   Tobacco abuse 01/19/2016   Benign essential hypertension 01/18/2016   Gastroesophageal reflux disease without esophagitis 01/18/2016   Hypogonadism in male 01/18/2016    Past Surgical History:  Procedure Laterality Date   CHOLECYSTECTOMY  2013   COLONOSCOPY  01/10/2009   Predominantly left colonic diverticulosis. Small internal hemorrhoids. Otherwise normal colonoscopy.    INGUINAL HERNIA REPAIR  2013   Dr. Janit       Home Medications    Prior to Admission medications   Medication Sig Start Date End Date Taking? Authorizing Provider  methocarbamol (ROBAXIN) 500 MG tablet Take 500 mg by mouth 3 (three) times daily. 06/01/24  Yes [provider]  semaglutide-weight  management (WEGOVY) 0.25 MG/0.5ML SOAJ SQ injection Inject 0.25 mg into the skin once a week.   Yes [provider]  esomeprazole (NEXIUM) 20 MG capsule Take 20 mg by mouth 2 (two) times daily.    [provider]  fluticasone (FLONASE) 50 MCG/ACT nasal spray Place 1-2 sprays into both nostrils daily. 09/20/18   [provider]  glipiZIDE (GLUCOTROL XL) 5 MG 24 hr tablet Take 1 tablet by mouth daily. 07/07/21   [provider]  losartan (COZAAR) 100 MG tablet Take 1 tablet by mouth daily. 06/09/21   [provider]  metoprolol succinate (TOPROL-XL) 25 MG 24 hr tablet Take 1 tablet by mouth daily. 02/24/16   [provider]  neomycin-polymyxin-hydrocortisone (CORTISPORIN) 3.5-10000-1 OTIC suspension Place 4 drops into the affected ear four times daily for 7 days 04/24/19   [provider]  pravastatin (PRAVACHOL) 80 MG tablet Take 80 mg by mouth at bedtime. 10/17/18   [provider]  ramipril (ALTACE) 5 MG capsule Take by mouth.    [provider]  sildenafil (REVATIO) 20 MG tablet Take 20 mg by mouth as needed. Take up to 4 tablets by mouth 1 hour prior to sex as needed. 01/03/18   [provider]  spironolactone (ALDACTONE) 25 MG tablet Take 25 mg by mouth daily. 06/09/18   [provider]  SUPREP BOWEL PREP  KIT 17.5-3.13-1.6 GM/177ML SOLN Take 1 kit by mouth as directed. 10/31/18   Charlanne Groom, MD  testosterone cypionate (DEPOTESTOSTERONE CYPIONATE) 200  MG/ML injection Inject into the muscle.    [provider]  traMADol (ULTRAM) 50 MG tablet Take 50 mg by mouth every 8 (eight) hours as needed. 10/17/18   [provider]    Family History Family History  Problem Relation Age of Onset   Diabetes Father    Colon cancer Neg Hx     Social History Social History   Tobacco Use   Smoking status: Former   Smokeless tobacco: Former  Building Services Engineer status: Never Used  Substance  Use Topics   Alcohol use: Not Currently   Drug use: Never     Allergies   Patient has no known allergies.   Review of Systems Review of Systems  Cardiovascular:  Positive for chest pain.     Physical Exam Triage Vital Signs ED Triage Vitals  Encounter Vitals Group     BP 07/09/24 1203 (!) 145/81     Girls Systolic BP Percentile --      Girls Diastolic BP Percentile --      Boys Systolic BP Percentile --      Boys Diastolic BP Percentile --      Pulse Rate 07/09/24 1203 78     Resp 07/09/24 1203 20     Temp 07/09/24 1203 98.8 F (37.1 C)     Temp Source 07/09/24 1203 Oral     SpO2 07/09/24 1203 94 %     Weight --      Height --      Head Circumference --      Peak Flow --      Pain Score 07/09/24 1204 0     Pain Loc --      Pain Education --      Exclude from Growth Chart --    No data found.  Updated Vital Signs BP (!) 145/81 (BP Location: Right Arm)   Pulse 78   Temp 98.8 F (37.1 C) (Oral)   Resp 20   SpO2 94%   Visual Acuity Right Eye Distance:   Left Eye Distance:   Bilateral Distance:    Right Eye Near:   Left Eye Near:    Bilateral Near:     Physical Exam   UC Treatments / Results  Labs (all labs ordered are listed, but only abnormal results are displayed) Labs Reviewed - No data to display  EKG   Radiology No results found.  Procedures ED EKG  Date/Time: 07/09/2024 12:43 PM  Performed by: Ival Domino, FNP Authorized by: Ival Domino, FNP   ECG interpreted by ED Physician in the absence of a cardiologist: yes Teddi Ival, FNP-BC)   Previous ECG:    Previous ECG:  Unavailable Interpretation:    Interpretation: normal     Details:  NSR with Left Axis Deviation and previous/old Inferior Infarct Rate:    ECG rate:  80 Rhythm:    Rhythm: sinus rhythm   Ectopy:    Ectopy: none   QRS:    QRS axis:  Left   QRS intervals:  Normal   QRS conduction: normal   ST segments:    ST segments:  Normal T waves:    T waves:  normal   Q waves:    Abnormal Q-waves: present     Q waves:  II, III and aVR  (including critical care time)  Medications Ordered in UC Medications - No data to display  Initial Impression / Assessment and Plan / UC Course  I have  reviewed the triage vital signs and the nursing notes.  Pertinent labs & imaging results that were available during my care of the patient were reviewed by me and considered in my medical decision making (see chart for details).  Plan of Care: Chest wall pain with cough and chest pain: ECG was essentially normal.  Chest x-ray was negative.  Discussed costochondritis as a probable cause of his chest pain.  Patient will try over-the-counter NSAIDs or acetaminophen for pain.  Patient will follow-up with cardiology for recheck since it is been 2 to 3 years since he was seen.  Follow-up here if needed.  I reviewed the plan of care with the patient and/or the patient's guardian.  The patient and/or guardian had time to ask questions and acknowledged that the questions were answered.  I provided instruction on symptoms or reasons to return here or to go to an ER, if symptoms/condition did not improve, worsened or if new symptoms occurred.  Final Clinical Impressions(s) / UC Diagnoses   Final diagnoses:  Chest pain, unspecified type  Acute cough  Chest wall pain     Discharge Instructions      Chest wall pain with cough and chest pain: ECG was essentially normal.  Chest x-ray was negative.  Discussed costochondritis as a probable cause of his chest pain.  Patient will try over-the-counter NSAIDs or acetaminophen for pain.  Patient will follow-up with cardiology for recheck since it is been 2 to 3 years since he was seen.  Follow-up here if needed.     ED Prescriptions   None    PDMP not reviewed this encounter.   Ival Domino, FNP 07/09/24 1330

## 2024-07-09 NOTE — Discharge Instructions (Addendum)
 Chest wall pain with cough and chest pain: ECG was essentially normal.  Chest x-ray was negative.  Discussed costochondritis as a probable cause of his chest pain.  Patient will try over-the-counter NSAIDs or acetaminophen for pain.  Patient will follow-up with cardiology for recheck since it is been 2 to 3 years since he was seen.  Follow-up here if needed.

## 2024-07-09 NOTE — ED Triage Notes (Signed)
 Fatigue onset yesterday. Woke up this morning with pain to chest during deep breath. Took mucinex and discomfort eased. States has gotten clammy a few times. Reports similar symptoms once and was dx with pneumonia. No cough, no sinus congestion. No hx of lung disease. No pain currently. States very painful this morning.

## 2024-07-09 NOTE — Progress Notes (Signed)
 Negative.  Patient updated.  Follow-up as planned.
